# Patient Record
Sex: Male | Born: 1940 | Race: White | Hispanic: No | Marital: Married | Smoking: Former smoker
Health system: Southern US, Community
[De-identification: ages and names within clinical notes are randomized; demographics above are authoritative.]

## PROBLEM LIST (undated history)

## (undated) DIAGNOSIS — I739 Peripheral vascular disease, unspecified: Secondary | ICD-10-CM

## (undated) DIAGNOSIS — I10 Essential (primary) hypertension: Secondary | ICD-10-CM

## (undated) DIAGNOSIS — C801 Malignant (primary) neoplasm, unspecified: Secondary | ICD-10-CM

## (undated) DIAGNOSIS — E119 Type 2 diabetes mellitus without complications: Secondary | ICD-10-CM

## (undated) HISTORY — PX: COLONOSCOPY: SHX174

## (undated) HISTORY — PX: PROSTATECTOMY: SHX69

---

## 2018-02-01 ENCOUNTER — Emergency Department (HOSPITAL_COMMUNITY): Payer: Medicare Other

## 2018-02-01 ENCOUNTER — Emergency Department (HOSPITAL_COMMUNITY)
Admission: EM | Admit: 2018-02-01 | Discharge: 2018-02-01 | Disposition: A | Discharge status: Home / Self Care | Payer: Medicare Other | Attending: Emergency Medicine | Admitting: Emergency Medicine

## 2018-02-01 ENCOUNTER — Encounter (HOSPITAL_COMMUNITY): Payer: Self-pay

## 2018-02-01 DIAGNOSIS — S2232XA Fracture of one rib, left side, initial encounter for closed fracture: Secondary | ICD-10-CM | POA: Diagnosis not present

## 2018-02-01 DIAGNOSIS — S42302A Unspecified fracture of shaft of humerus, left arm, initial encounter for closed fracture: Secondary | ICD-10-CM | POA: Insufficient documentation

## 2018-02-01 DIAGNOSIS — Y939 Activity, unspecified: Secondary | ICD-10-CM | POA: Diagnosis not present

## 2018-02-01 DIAGNOSIS — Z23 Encounter for immunization: Secondary | ICD-10-CM | POA: Insufficient documentation

## 2018-02-01 DIAGNOSIS — Y999 Unspecified external cause status: Secondary | ICD-10-CM | POA: Insufficient documentation

## 2018-02-01 DIAGNOSIS — G40909 Epilepsy, unspecified, not intractable, without status epilepticus: Secondary | ICD-10-CM | POA: Diagnosis not present

## 2018-02-01 DIAGNOSIS — R109 Unspecified abdominal pain: Secondary | ICD-10-CM | POA: Diagnosis present

## 2018-02-01 DIAGNOSIS — I10 Essential (primary) hypertension: Secondary | ICD-10-CM | POA: Insufficient documentation

## 2018-02-01 DIAGNOSIS — Y929 Unspecified place or not applicable: Secondary | ICD-10-CM | POA: Insufficient documentation

## 2018-02-01 DIAGNOSIS — Z87891 Personal history of nicotine dependence: Secondary | ICD-10-CM | POA: Insufficient documentation

## 2018-02-01 DIAGNOSIS — W228XXA Striking against or struck by other objects, initial encounter: Secondary | ICD-10-CM | POA: Insufficient documentation

## 2018-02-01 DIAGNOSIS — Z79899 Other long term (current) drug therapy: Secondary | ICD-10-CM | POA: Diagnosis not present

## 2018-02-01 DIAGNOSIS — Z7984 Long term (current) use of oral hypoglycemic drugs: Secondary | ICD-10-CM | POA: Diagnosis not present

## 2018-02-01 DIAGNOSIS — E119 Type 2 diabetes mellitus without complications: Secondary | ICD-10-CM | POA: Insufficient documentation

## 2018-02-01 DIAGNOSIS — S42392A Other fracture of shaft of left humerus, initial encounter for closed fracture: Secondary | ICD-10-CM

## 2018-02-01 HISTORY — DX: Malignant (primary) neoplasm, unspecified: C80.1

## 2018-02-01 HISTORY — DX: Essential (primary) hypertension: I10

## 2018-02-01 HISTORY — DX: Type 2 diabetes mellitus without complications: E11.9

## 2018-02-01 LAB — CBC WITH DIFFERENTIAL/PLATELET
ABS IMMATURE GRANULOCYTES: 0.08 10*3/uL — AB (ref 0.00–0.07)
Basophils Absolute: 0.1 10*3/uL (ref 0.0–0.1)
Basophils Relative: 1 %
EOS PCT: 6 %
Eosinophils Absolute: 0.5 10*3/uL (ref 0.0–0.5)
HCT: 39.6 % (ref 39.0–52.0)
HEMOGLOBIN: 13 g/dL (ref 13.0–17.0)
Immature Granulocytes: 1 %
LYMPHS PCT: 21 %
Lymphs Abs: 1.7 10*3/uL (ref 0.7–4.0)
MCH: 29.2 pg (ref 26.0–34.0)
MCHC: 32.8 g/dL (ref 30.0–36.0)
MCV: 89 fL (ref 80.0–100.0)
MONO ABS: 0.5 10*3/uL (ref 0.1–1.0)
Monocytes Relative: 6 %
NEUTROS ABS: 5.4 10*3/uL (ref 1.7–7.7)
Neutrophils Relative %: 65 %
Platelets: 193 10*3/uL (ref 150–400)
RBC: 4.45 MIL/uL (ref 4.22–5.81)
RDW: 13.5 % (ref 11.5–15.5)
WBC: 8.2 10*3/uL (ref 4.0–10.5)
nRBC: 0 % (ref 0.0–0.2)

## 2018-02-01 LAB — BASIC METABOLIC PANEL
Anion gap: 12 (ref 5–15)
BUN: 8 mg/dL (ref 8–23)
CHLORIDE: 102 mmol/L (ref 98–111)
CO2: 23 mmol/L (ref 22–32)
Calcium: 8.8 mg/dL — ABNORMAL LOW (ref 8.9–10.3)
Creatinine, Ser: 1.36 mg/dL — ABNORMAL HIGH (ref 0.61–1.24)
GFR calc Af Amer: 57 mL/min — ABNORMAL LOW (ref >60)
GFR calc non Af Amer: 49 mL/min — ABNORMAL LOW (ref >60)
GLUCOSE: 154 mg/dL — AB (ref 70–99)
POTASSIUM: 3.2 mmol/L — AB (ref 3.5–5.1)
Sodium: 137 mmol/L (ref 135–145)

## 2018-02-01 MED ORDER — IOHEXOL 300 MG/ML  SOLN
100.0000 mL | Freq: Once | INTRAMUSCULAR | Status: AC | PRN
  Administered 2018-02-01: 100 mL via INTRAVENOUS

## 2018-02-01 MED ORDER — HYDROMORPHONE HCL 1 MG/ML IJ SOLN
0.5000 mg | Freq: Once | INTRAMUSCULAR | Status: AC
  Administered 2018-02-01: 0.5 mg via INTRAVENOUS
  Filled 2018-02-01: qty 1

## 2018-02-01 MED ORDER — OXYCODONE-ACETAMINOPHEN 5-325 MG PO TABS: 1.0000 | ORAL_TABLET | ORAL | 0 refills | Status: DC | PRN

## 2018-02-01 MED ORDER — TETANUS-DIPHTH-ACELL PERTUSSIS 5-2.5-18.5 LF-MCG/0.5 IM SUSP
0.5000 mL | Freq: Once | INTRAMUSCULAR | Status: AC
  Administered 2018-02-01: 0.5 mL via INTRAMUSCULAR
  Filled 2018-02-01: qty 0.5

## 2018-02-01 NOTE — Discharge Instructions (Addendum)
Please call the orthopedic surgeon for follow-up

## 2018-02-01 NOTE — ED Notes (Signed)
The pt had a jerking episode that lasted approx 45 secs with loc.  Post ictal immdediately following with low sats  Nasal 02 at 4 liters

## 2018-02-01 NOTE — ED Notes (Signed)
Pt responding now  His pain is better but he feels terrible

## 2018-02-01 NOTE — ED Notes (Signed)
Pt looking for a ride home

## 2018-02-01 NOTE — Progress Notes (Signed)
Orthopedic Tech Progress Note Patient Details:  Jose Alvarado November 30, 1940 [id number]  Ortho Devices Type of Ortho Device: Arm sling, Coapt Ortho Device/Splint Location: lue Ortho Device/Splint Interventions: Ordered, Application, Adjustment   Post Interventions Patient Tolerated: Well Instructions Provided: Care of device, Adjustment of device   Jose Alvarado 02/01/2018, 6:38 PM

## 2018-02-01 NOTE — ED Notes (Addendum)
Dr Venora Maples attempting to place temporary splint to pt's left arm. Pt began having seizure like activity, shaking, unresponsive x 45 seconds. Pt then became responsive and began talking again. Pt was asked at this time if he had hx of seizures and said no.

## 2018-02-01 NOTE — ED Notes (Addendum)
Ortho paged to come to room per Dr Venora Maples request. Dr Venora Maples gave order for 0.5mg  dilaudid

## 2018-02-01 NOTE — ED Provider Notes (Signed)
[facility] EMERGENCY DEPARTMENT Provider Note   CSN: [id number] Arrival date & time: 02/01/18  1517     History   Chief Complaint Chief Complaint  Patient presents with  . Arm Injury    HPI Jose Alvarado is a 77 y.o. male.  HPI 77 year old male presents the emergency department after a limb was being cut down off a tree and swung and struck him in the left side.  He presents with obvious deformity to his left midshaft humerus.  He reports severe pain in his left arm.  Denies closed head injury.  He was not wearing a hard hat.  Denies significant neck pain.  Reports left lateral chest and abdominal pain.  Symptoms are moderate to severe in severity in his left humerus.  Pain in his chest and abdomen are mild in severity.  Moves all 4 extremities.  Normal grip strength left hand.   Past Medical History:  Diagnosis Date  . Cancer [facility])    prostate  . Diabetes mellitus without complication ([place])   . Hypertension     There are no active problems to display for this patient.   Past Surgical History:  Procedure Laterality Date  . PROSTATECTOMY          Home Medications    Prior to Admission medications   Medication Sig Start Date End Date Taking? Authorizing Provider  carbamazepine (TEGRETOL) 200 MG tablet Take 200 mg by mouth 4 (four) times daily.   Yes [provider]  metFORMIN (GLUCOPHAGE) 500 MG tablet Take 500 mg by mouth 2 (two) times a week. 12/09/17  Yes [provider]  pantoprazole (PROTONIX) 20 MG tablet Take 20 mg by mouth daily. 01/19/18  Yes [provider]  pioglitazone (ACTOS) 15 MG tablet Take 15 mg by mouth every morning. 07/26/17  Yes [provider]    Family History History reviewed. No pertinent family history.  Social History Social History   Tobacco Use  . Smoking status: Former Smoker    Last attempt to quit: 02/01/1998    Years since quitting: 20.0  Substance Use Topics  . Alcohol  use: Never    Frequency: Never  . Drug use: Never     Allergies   Patient has no known allergies.   Review of Systems Review of Systems  All other systems reviewed and are negative.    Physical Exam Updated Vital Signs BP 137/72   Pulse 72   Temp 98.1 F (36.7 C) (Oral)   Resp (!) 22   SpO2 93%   Physical Exam  Constitutional: He is oriented to person, place, and time. He appears well-developed and well-nourished.  HENT:  Head: Normocephalic and atraumatic.  Eyes: EOM are normal.  Neck: Normal range of motion.  Cardiovascular: Normal rate, regular rhythm and normal heart sounds.  Pulmonary/Chest: Effort normal and breath sounds normal. No respiratory distress.  Tenderness left lateral chest wall without crepitus  Abdominal: Soft. He exhibits no distension. There is no tenderness.  Musculoskeletal: Normal range of motion.  Obvious midshaft humerus fracture on the left with instability and angulation.  Normal left radial pulse.  Normal grip strength left hand.  Full range of motion bilateral hips, knees, ankles.  Full range of motion of bilateral shoulders, wrists, right elbow.  Limited range of motion of the left elbow secondary to pain  Neurological: He is alert and oriented to person, place, and time.  Skin: Skin is warm and dry.  Psychiatric: He has a  normal mood and affect. Judgment normal.  Nursing note and vitals reviewed.    ED Treatments / Results  Labs (all labs ordered are listed, but only abnormal results are displayed) Labs Reviewed  CBC WITH DIFFERENTIAL/PLATELET - Abnormal; Notable for the following components:      Result Value   Abs Immature Granulocytes 0.08 (*)    All other components within normal limits  BASIC METABOLIC PANEL - Abnormal; Notable for the following components:   Potassium 3.2 (*)    Glucose, Bld 154 (*)    Creatinine, Ser 1.36 (*)    Calcium 8.8 (*)    GFR calc non Af Amer 49 (*)    GFR calc Af Amer 57 (*)    All other  components within normal limits    EKG EKG Interpretation  Date/Time:  Wednesday February 01 2018 15:41:10 EST Ventricular Rate:  67 PR Interval:    QRS Duration: 101 QT Interval:  404 QTC Calculation: 427 R Axis:   27 Text Interpretation:  normal sinus rhythm Borderline repolarization abnormality Confirmed by Jola Schmidt [phone]) on 02/01/2018 4:28:26 PM   Radiology Ct Head Wo Contrast  Result Date: 02/01/2018 CLINICAL DATA:  A large branch fell on the patient's left side while cutting a tree today. Seizure. EXAM: CT HEAD WITHOUT CONTRAST CT CERVICAL SPINE WITHOUT CONTRAST TECHNIQUE: Multidetector CT imaging of the head and cervical spine was performed following the standard protocol without intravenous contrast. Multiplanar CT image reconstructions of the cervical spine were also generated. COMPARISON:  None. FINDINGS: CT HEAD FINDINGS Brain: The brainstem, cerebellum, cerebral peduncles, thalami, basal ganglia, basilar cisterns, and ventricular system appear within normal limits. No intracranial hemorrhage, mass lesion, or acute CVA. Vascular: Unremarkable Skull: Unremarkable Sinuses/Orbits: There is complete opacification of the left frontal sinus, most of the ethmoid air cells, the left sphenoid sinus, and the right maxillary sinus, with mixed density in the right maxillary sinus noted. There is also some mixed density in the nearly completely opacified right frontal sinus which also contains a small amount of gas, as well as the nearly completely opacified left maxillary and right sphenoid sinus. Other: No supplemental non-categorized findings. CT CERVICAL SPINE FINDINGS Alignment: No vertebral subluxation is observed. Skull base and vertebrae: There is calcification along the transverse ligament of C1 with spurring at the anterior C1-2 articulation. No appreciable cervical spine fracture. Soft tissues and spinal canal: Unremarkable Disc levels:  No significant bony impingement. Upper  chest: Faint interstitial accentuation. Please see dedicated CT of the chest report. Other: No supplemental non-categorized findings. IMPRESSION: 1. No acute intracranial findings or acute cervical spine findings. 2. Prominent paranasal sinusitis, with multiple sinuses completely opacified by mixed density contents. Underlying acute sinusitis is not excluded. Electronically Signed   By: Van Clines M.D.   On: 02/01/2018 18:02   Ct Cervical Spine Wo Contrast  Result Date: 02/01/2018 CLINICAL DATA:  A large branch fell on the patient's left side while cutting a tree today. Seizure. EXAM: CT HEAD WITHOUT CONTRAST CT CERVICAL SPINE WITHOUT CONTRAST TECHNIQUE: Multidetector CT imaging of the head and cervical spine was performed following the standard protocol without intravenous contrast. Multiplanar CT image reconstructions of the cervical spine were also generated. COMPARISON:  None. FINDINGS: CT HEAD FINDINGS Brain: The brainstem, cerebellum, cerebral peduncles, thalami, basal ganglia, basilar cisterns, and ventricular system appear within normal limits. No intracranial hemorrhage, mass lesion, or acute CVA. Vascular: Unremarkable Skull: Unremarkable Sinuses/Orbits: There is complete opacification of the left frontal sinus, most of the  ethmoid air cells, the left sphenoid sinus, and the right maxillary sinus, with mixed density in the right maxillary sinus noted. There is also some mixed density in the nearly completely opacified right frontal sinus which also contains a small amount of gas, as well as the nearly completely opacified left maxillary and right sphenoid sinus. Other: No supplemental non-categorized findings. CT CERVICAL SPINE FINDINGS Alignment: No vertebral subluxation is observed. Skull base and vertebrae: There is calcification along the transverse ligament of C1 with spurring at the anterior C1-2 articulation. No appreciable cervical spine fracture. Soft tissues and spinal canal:  Unremarkable Disc levels:  No significant bony impingement. Upper chest: Faint interstitial accentuation. Please see dedicated CT of the chest report. Other: No supplemental non-categorized findings. IMPRESSION: 1. No acute intracranial findings or acute cervical spine findings. 2. Prominent paranasal sinusitis, with multiple sinuses completely opacified by mixed density contents. Underlying acute sinusitis is not excluded. Electronically Signed   By: Van Clines M.D.   On: 02/01/2018 18:02   Dg Chest Portable 1 View  Result Date: 02/01/2018 CLINICAL DATA:  Hit by falling tree branch.  Left rib pain EXAM: PORTABLE CHEST 1 VIEW COMPARISON:  None. FINDINGS: There is a fracture through the left lateral 7th rib. No effusion or pneumothorax. Heart is normal size. No confluent airspace opacities. Old healed right rib fractures. IMPRESSION: Nondisplaced left lateral 7th rib fracture. No acute cardiopulmonary disease. Electronically Signed   By: Rolm Baptise M.D.   On: 02/01/2018 16:04   Dg Humerus Left  Result Date: 02/01/2018 CLINICAL DATA:  Acute LEFT arm pain following tree injury today. Initial encounter. EXAM: LEFT HUMERUS - 2+ VIEW COMPARISON:  None. FINDINGS: A transverse fracture of the mid-distal humeral diaphysis is noted with 1 cm anterior displacement and apex LATERAL angulation. No other acute bony or joint abnormalities noted. IMPRESSION: Displaced angulated mid-distal humeral fracture. Electronically Signed   By: Margarette Canada M.D.   On: 02/01/2018 16:06    Procedures .Splint Application Performed by: Jola Schmidt, MD Authorized by: Jola Schmidt, MD   .Ortho Injury Treatment Performed by: Jola Schmidt, MD Authorized by: Jola Schmidt, MD     [place] APPLICATION Authorized by: Jola Schmidt Consent: Verbal consent obtained. Risks and benefits: risks, benefits and alternatives were discussed Consent given by: patient Splint applied by: orthopedic technician Location details:  left upper extremity Splint type: Coaptation splint Supplies used: orthoglass Post-procedure: The splinted body part was neurovascularly unchanged following the procedure. Patient tolerance: Patient tolerated the procedure well with no immediate complications.   Definitive Fracture Care  Definitive fracture care was performed for the patient's left rib fracture. Treatment includes management of pain Fracture related discharge instructions were provided Symptomatic control measures provided to the patient     Medications Ordered in ED Medications  HYDROmorphone (DILAUDID) injection 0.5 mg (0.5 mg Intravenous Given 02/01/18 1538)  Tdap (BOOSTRIX) injection 0.5 mL (0.5 mLs Intramuscular Given 02/01/18 1839)  iohexol (OMNIPAQUE) 300 MG/ML solution 100 mL (100 mLs Intravenous Contrast Given 02/01/18 1725)     Initial Impression / Assessment and Plan / ED Course  I have reviewed the triage vital signs and the nursing notes.  Pertinent labs & imaging results that were available during my care of the patient were reviewed by me and considered in my medical decision making (see chart for details).     Pain controlled in the emergency department.  Ambulatory in the ER.  Home with pain medication.  Standard rib fracture precautions given.  Outpatient orthopedic  follow-up for his midshaft left humerus fracture.  Normal left radial pulse.  Coaptation splint placed for comfort and stability.  Patient understands return to the emergency department for new or worsening symptoms.  Final Clinical Impressions(s) / ED Diagnoses   Final diagnoses:  Other fracture of shaft of left humerus, initial encounter for closed fracture  Closed fracture of one rib of left side, initial encounter    ED Discharge Orders    None       Jola Schmidt, MD 02/01/18 [phone]

## 2018-02-01 NOTE — ED Notes (Signed)
Money in wallet 1359.86 placed in security  Paper with pt  Pt notified thaT his ring 2 wallets and kknife  Were placed in the safe

## 2018-02-01 NOTE — ED Notes (Signed)
The pt has talked to his wife on the phone  She does not drive shes blind

## 2018-02-01 NOTE — ED Triage Notes (Signed)
Pt was cutting a tree and a large brand fell and landed on the pt's left arm. Pt has obvious left humurus deformity. Strong radial pulse in affected extremity. Pt was drowsy upon ems arrival, given 161mcg of fentanyl. VSS. Axox4.

## 2018-02-01 NOTE — ED Notes (Signed)
The pt returned from c-t alert pain in arm with movwemwent

## 2018-02-01 NOTE — ED Notes (Signed)
This RN in CT with pt. Pt tolerating CT well.

## 2018-02-02 NOTE — ED Notes (Signed)
Money and personal belongings returned to the pt before he left

## 2018-02-06 ENCOUNTER — Encounter (INDEPENDENT_AMBULATORY_CARE_PROVIDER_SITE_OTHER): Payer: Self-pay | Admitting: Orthopedic Surgery

## 2018-02-06 ENCOUNTER — Encounter (HOSPITAL_COMMUNITY): Payer: Self-pay | Admitting: *Deleted

## 2018-02-06 ENCOUNTER — Other Ambulatory Visit (INDEPENDENT_AMBULATORY_CARE_PROVIDER_SITE_OTHER): Payer: Self-pay | Admitting: Orthopedic Surgery

## 2018-02-06 ENCOUNTER — Ambulatory Visit (INDEPENDENT_AMBULATORY_CARE_PROVIDER_SITE_OTHER): Payer: Medicare Other | Admitting: Orthopedic Surgery

## 2018-02-06 ENCOUNTER — Other Ambulatory Visit: Payer: Self-pay

## 2018-02-06 DIAGNOSIS — S42322A Displaced transverse fracture of shaft of humerus, left arm, initial encounter for closed fracture: Secondary | ICD-10-CM | POA: Diagnosis not present

## 2018-02-06 NOTE — Progress Notes (Signed)
Office Visit Note   Patient: Jose Alvarado           Date of Birth: 1940/07/20           MRN: [id number] Visit Date: 02/06/2018 Requested by: No referring provider defined for this encounter. PCP: Patient, No Pcp Per  Subjective: Chief Complaint  Patient presents with  . Left Upper Arm - Fracture, Pain    HPI: Patient presents with left arm pain.  Injured his left arm 02/01/2018 when a branch hit him.  He is right-hand dominant.  He still does carpentry work.  He does take medication for diabetes.  He lives with his wife who cannot really be particularly helpful during a recovery.Marland Kitchen  He denies any numbness and tingling in the hand but does report swelling.  Denies any other orthopedic complaints              ROS: All systems reviewed are negative as they relate to the chief complaint within the history of present illness.  Patient denies  fevers or chills.   Assessment & Plan: Visit Diagnoses:  1. Displaced transverse fracture of shaft of humerus, left arm, initial encounter for closed fracture     Plan: Impression is displaced fracture of the left humerus.  Plan is lengthy discussion about operative and nonoperative treatment options.  Patient is in a significant amount of pain with this left arm.  I think his best option would be open reduction internal fixation of the fracture.  Risk and benefits are discussed including but not limited to infection nerve vessel damage potential need for more surgery.  He will need to have a period of diminished activity after the surgery.  Patient understands risk benefits and wishes to proceed.  All questions answered  Follow-Up Instructions: Return in about 1 week (around 02/13/2018).   Orders:  No orders of the defined types were placed in this encounter.  No orders of the defined types were placed in this encounter.     Procedures: No procedures performed   Clinical Data: No additional findings.  Objective: Vital Signs: There were  no vitals taken for this visit.  Physical Exam:   Constitutional: Patient appears well-developed HEENT:  Head: Normocephalic Eyes:EOM are normal Neck: Normal range of motion Cardiovascular: Normal rate Pulmonary/chest: Effort normal Neurologic: Patient is alert Skin: Skin is warm Psychiatric: Patient has normal mood and affect    Ortho Exam: Ortho exam demonstrates unstable fracture of the humeral shaft.  Patient does has impact and intact EPL FPL interosseous function with palpable radial pulse on the left-hand side.  Does have some bruising ecchymosis in the skin area but the skin is intact down to the flexion crease which has a slight amount of maceration or dampness from his flexed position.  Specialty Comments:  No specialty comments available.  Imaging: No results found.   PMFS History: There are no active problems to display for this patient.  Past Medical History:  Diagnosis Date  . Cancer [facility])    prostate  . Diabetes mellitus without complication ([place])   . Hypertension   . Peripheral vascular disease ([place])    PE 35 years after a broken leg    History reviewed. No pertinent family history.  Past Surgical History:  Procedure Laterality Date  . COLONOSCOPY    . PROSTATECTOMY     Social History   Occupational History  . Not on file  Tobacco Use  . Smoking status: Former Smoker    Last attempt  to quit: 02/01/1998    Years since quitting: 20.0  . Smokeless tobacco: Never Used  Substance and Sexual Activity  . Alcohol use: Never    Frequency: Never  . Drug use: Never  . Sexual activity: Not on file

## 2018-02-06 NOTE — Progress Notes (Signed)
Spoke with pt for pre-op for pre-op call. Pt denies cardiac history and HTN. States he's type 2 diabetic. Dones not know what his last A1C was. States he checks his blood sugar at night. Instructed pt not to take his Actos or Metformin in the AM. Instructed him to check his blood sugar in the AM when he gets up and every 2 hours until he leaves for the hospital. If blood sugar is 70 or below, treat with 1/2 cup of clear juice (apple or cranberry) and recheck blood sugar 15 minutes after drinking juice. If blood sugar continues to be 70 or below, call the Short Stay department and ask to speak to a nurse. Pt voiced understanding  Pt is to call the OR desk at 8 AM to find out time of surgery. Instructed pt to arrive 2 1/2 hours prior to that time. In Dr. Randel Pigg note it looks like surgery will be 6 PM or later. I spoke with Dr. Therisa Doyne prior to calling pt and he stated that pt could eat a regular breakfast by 9:00 AM, then clear liquids until 1:30 PM and then NPO. I gave these instructions to the patient and he voiced understanding.

## 2018-02-07 ENCOUNTER — Ambulatory Visit (HOSPITAL_COMMUNITY): Payer: Medicare Other | Admitting: Certified Registered Nurse Anesthetist

## 2018-02-07 ENCOUNTER — Ambulatory Visit (HOSPITAL_COMMUNITY)
Admission: RE | Admit: 2018-02-07 | Discharge: 2018-02-07 | Disposition: A | Discharge status: Home / Self Care | Payer: Medicare Other | Source: Ambulatory Visit | Attending: Orthopedic Surgery | Admitting: Orthopedic Surgery

## 2018-02-07 ENCOUNTER — Encounter (HOSPITAL_COMMUNITY)
Admission: RE | Disposition: A | Discharge status: Home / Self Care | Payer: Self-pay | Source: Ambulatory Visit | Attending: Orthopedic Surgery

## 2018-02-07 ENCOUNTER — Ambulatory Visit (HOSPITAL_COMMUNITY): Payer: Medicare Other

## 2018-02-07 ENCOUNTER — Encounter (HOSPITAL_COMMUNITY): Payer: Self-pay

## 2018-02-07 DIAGNOSIS — Y9289 Other specified places as the place of occurrence of the external cause: Secondary | ICD-10-CM | POA: Insufficient documentation

## 2018-02-07 DIAGNOSIS — S42322A Displaced transverse fracture of shaft of humerus, left arm, initial encounter for closed fracture: Secondary | ICD-10-CM | POA: Diagnosis present

## 2018-02-07 DIAGNOSIS — E1151 Type 2 diabetes mellitus with diabetic peripheral angiopathy without gangrene: Secondary | ICD-10-CM | POA: Diagnosis not present

## 2018-02-07 DIAGNOSIS — Z419 Encounter for procedure for purposes other than remedying health state, unspecified: Secondary | ICD-10-CM

## 2018-02-07 DIAGNOSIS — Z79899 Other long term (current) drug therapy: Secondary | ICD-10-CM | POA: Diagnosis not present

## 2018-02-07 DIAGNOSIS — W208XXA Other cause of strike by thrown, projected or falling object, initial encounter: Secondary | ICD-10-CM | POA: Insufficient documentation

## 2018-02-07 DIAGNOSIS — Z87891 Personal history of nicotine dependence: Secondary | ICD-10-CM | POA: Diagnosis not present

## 2018-02-07 DIAGNOSIS — Z7984 Long term (current) use of oral hypoglycemic drugs: Secondary | ICD-10-CM | POA: Diagnosis not present

## 2018-02-07 DIAGNOSIS — I10 Essential (primary) hypertension: Secondary | ICD-10-CM | POA: Insufficient documentation

## 2018-02-07 DIAGNOSIS — K219 Gastro-esophageal reflux disease without esophagitis: Secondary | ICD-10-CM | POA: Insufficient documentation

## 2018-02-07 HISTORY — PX: ORIF HUMERUS FRACTURE: SHX2126

## 2018-02-07 HISTORY — DX: Peripheral vascular disease, unspecified: I73.9

## 2018-02-07 LAB — GLUCOSE, CAPILLARY
GLUCOSE-CAPILLARY: 132 mg/dL — AB (ref 70–99)
Glucose-Capillary: 113 mg/dL — ABNORMAL HIGH (ref 70–99)

## 2018-02-07 SURGERY — OPEN REDUCTION INTERNAL FIXATION (ORIF) PROXIMAL HUMERUS FRACTURE
Anesthesia: Regional | Site: Shoulder | Laterality: Left

## 2018-02-07 MED ORDER — ROCURONIUM BROMIDE 10 MG/ML (PF) SYRINGE
PREFILLED_SYRINGE | INTRAVENOUS | Status: DC | PRN
  Administered 2018-02-07 (×2): 50 mg via INTRAVENOUS

## 2018-02-07 MED ORDER — ROCURONIUM BROMIDE 10 MG/ML (PF) SYRINGE: PREFILLED_SYRINGE | INTRAVENOUS | Status: DC | PRN

## 2018-02-07 MED ORDER — MIDAZOLAM HCL 2 MG/2ML IJ SOLN
INTRAMUSCULAR | Status: AC
  Filled 2018-02-07: qty 2

## 2018-02-07 MED ORDER — FENTANYL CITRATE (PF) 100 MCG/2ML IJ SOLN
25.0000 ug | INTRAMUSCULAR | Status: DC | PRN
  Administered 2018-02-07 (×2): 50 ug via INTRAVENOUS

## 2018-02-07 MED ORDER — LACTATED RINGERS IV SOLN
INTRAVENOUS | Status: DC
  Administered 2018-02-07 (×2): via INTRAVENOUS

## 2018-02-07 MED ORDER — FENTANYL CITRATE (PF) 250 MCG/5ML IJ SOLN
INTRAMUSCULAR | Status: AC
  Filled 2018-02-07: qty 5

## 2018-02-07 MED ORDER — FENTANYL CITRATE (PF) 250 MCG/5ML IJ SOLN
INTRAMUSCULAR | Status: DC | PRN
  Administered 2018-02-07 (×2): 50 ug via INTRAVENOUS

## 2018-02-07 MED ORDER — DEXAMETHASONE SODIUM PHOSPHATE 10 MG/ML IJ SOLN
INTRAMUSCULAR | Status: DC | PRN
  Administered 2018-02-07 (×2): 5 mg via INTRAVENOUS

## 2018-02-07 MED ORDER — PROPOFOL 10 MG/ML IV BOLUS
INTRAVENOUS | Status: DC | PRN
  Administered 2018-02-07: 160 mg via INTRAVENOUS

## 2018-02-07 MED ORDER — LIDOCAINE 2% (20 MG/ML) 5 ML SYRINGE
INTRAMUSCULAR | Status: DC | PRN
  Administered 2018-02-07: 40 mg via INTRAVENOUS

## 2018-02-07 MED ORDER — CEFAZOLIN SODIUM-DEXTROSE 2-4 GM/100ML-% IV SOLN
2.0000 g | INTRAVENOUS | Status: AC
  Administered 2018-02-07: 2 g via INTRAVENOUS

## 2018-02-07 MED ORDER — ROCURONIUM BROMIDE 50 MG/5ML IV SOSY
PREFILLED_SYRINGE | INTRAVENOUS | Status: AC
  Filled 2018-02-07: qty 5

## 2018-02-07 MED ORDER — BUPIVACAINE HCL (PF) 0.25 % IJ SOLN
INTRAMUSCULAR | Status: AC
  Filled 2018-02-07: qty 30

## 2018-02-07 MED ORDER — FENTANYL CITRATE (PF) 100 MCG/2ML IJ SOLN
INTRAMUSCULAR | Status: AC
  Filled 2018-02-07: qty 2

## 2018-02-07 MED ORDER — ROPIVACAINE HCL 5 MG/ML IJ SOLN
INTRAMUSCULAR | Status: DC | PRN
  Administered 2018-02-07: 30 mL via PERINEURAL

## 2018-02-07 MED ORDER — EPHEDRINE SULFATE 50 MG/ML IJ SOLN
INTRAMUSCULAR | Status: DC | PRN
  Administered 2018-02-07 (×2): 10 mg via INTRAVENOUS
  Administered 2018-02-07 (×2): 15 mg via INTRAVENOUS

## 2018-02-07 MED ORDER — SUCCINYLCHOLINE CHLORIDE 200 MG/10ML IV SOSY
PREFILLED_SYRINGE | INTRAVENOUS | Status: AC
  Filled 2018-02-07: qty 10

## 2018-02-07 MED ORDER — CHLORHEXIDINE GLUCONATE 4 % EX LIQD: 60.0000 mL | Freq: Once | CUTANEOUS | Status: DC

## 2018-02-07 MED ORDER — EPHEDRINE 5 MG/ML INJ
INTRAVENOUS | Status: AC
  Filled 2018-02-07: qty 20

## 2018-02-07 MED ORDER — ONDANSETRON HCL 4 MG/2ML IJ SOLN
INTRAMUSCULAR | Status: DC | PRN
  Administered 2018-02-07: 4 mg via INTRAVENOUS

## 2018-02-07 MED ORDER — SUGAMMADEX SODIUM 200 MG/2ML IV SOLN
INTRAVENOUS | Status: DC | PRN
  Administered 2018-02-07: 300 mg via INTRAVENOUS

## 2018-02-07 MED ORDER — FENTANYL CITRATE (PF) 100 MCG/2ML IJ SOLN
INTRAMUSCULAR | Status: AC
  Administered 2018-02-07: 50 ug via INTRAVENOUS
  Filled 2018-02-07: qty 2

## 2018-02-07 MED ORDER — CEFAZOLIN SODIUM-DEXTROSE 2-4 GM/100ML-% IV SOLN
INTRAVENOUS | Status: AC
  Filled 2018-02-07: qty 100

## 2018-02-07 MED ORDER — ONDANSETRON HCL 4 MG/2ML IJ SOLN: 4.0000 mg | Freq: Once | INTRAMUSCULAR | Status: DC | PRN

## 2018-02-07 MED ORDER — FENTANYL CITRATE (PF) 100 MCG/2ML IJ SOLN
50.0000 ug | Freq: Once | INTRAMUSCULAR | Status: AC
  Administered 2018-02-07: 50 ug via INTRAVENOUS

## 2018-02-07 SURGICAL SUPPLY — 54 items
ALCOHOL 70% 16 OZ (MISCELLANEOUS) ×3 IMPLANT
BANDAGE ACE 4X5 VEL STRL LF (GAUZE/BANDAGES/DRESSINGS) IMPLANT
BANDAGE ACE 6X5 VEL STRL LF (GAUZE/BANDAGES/DRESSINGS) IMPLANT
BENZOIN TINCTURE PRP APPL 2/3 (GAUZE/BANDAGES/DRESSINGS) IMPLANT
BIT DRILL NCB 4 L24 (BIT) ×3 IMPLANT
BIT DRILL QC 3.3X195 (BIT) ×3 IMPLANT
BNDG COHESIVE 4X5 TAN STRL (GAUZE/BANDAGES/DRESSINGS) ×3 IMPLANT
CAP LOCK NCB (Cap) ×12 IMPLANT
CHLORAPREP W/TINT 26ML (MISCELLANEOUS) ×6 IMPLANT
COVER SURGICAL LIGHT HANDLE (MISCELLANEOUS) ×3 IMPLANT
COVER WAND RF STERILE (DRAPES) ×3 IMPLANT
DRAIN PENROSE 1/2X12 LTX STRL (WOUND CARE) IMPLANT
DRAPE C-ARM 42X72 X-RAY (DRAPES) ×3 IMPLANT
DRAPE IMP U-DRAPE 54X76 (DRAPES) ×3 IMPLANT
DRAPE U-SHAPE 47X51 STRL (DRAPES) ×6 IMPLANT
DRSG PAD ABDOMINAL 8X10 ST (GAUZE/BANDAGES/DRESSINGS) IMPLANT
ELECT REM PT RETURN 9FT ADLT (ELECTROSURGICAL) ×3
ELECTRODE REM PT RTRN 9FT ADLT (ELECTROSURGICAL) ×1 IMPLANT
FACESHIELD WRAPAROUND (MASK) IMPLANT
GAUZE SPONGE 4X4 12PLY STRL (GAUZE/BANDAGES/DRESSINGS) ×6 IMPLANT
GAUZE XEROFORM 5X9 LF (GAUZE/BANDAGES/DRESSINGS) IMPLANT
GLOVE BIOGEL PI IND STRL 8 (GLOVE) ×1 IMPLANT
GLOVE BIOGEL PI INDICATOR 8 (GLOVE) ×2
GLOVE SURG ORTHO 8.0 STRL STRW (GLOVE) ×3 IMPLANT
GOWN STRL REUS W/ TWL LRG LVL3 (GOWN DISPOSABLE) ×2 IMPLANT
GOWN STRL REUS W/ TWL XL LVL3 (GOWN DISPOSABLE) ×1 IMPLANT
GOWN STRL REUS W/TWL LRG LVL3 (GOWN DISPOSABLE) ×4
GOWN STRL REUS W/TWL XL LVL3 (GOWN DISPOSABLE) ×2
HYDROGEN PEROXIDE 16OZ (MISCELLANEOUS) ×3 IMPLANT
KIT BASIN OR (CUSTOM PROCEDURE TRAY) ×3 IMPLANT
KIT TURNOVER KIT B (KITS) ×3 IMPLANT
MANIFOLD NEPTUNE II (INSTRUMENTS) ×3 IMPLANT
NEEDLE 21X1 OR PACK (NEEDLE) IMPLANT
NS IRRIG 1000ML POUR BTL (IV SOLUTION) ×3 IMPLANT
PACK SHOULDER (CUSTOM PROCEDURE TRAY) ×3 IMPLANT
PAD ARMBOARD 7.5X6 YLW CONV (MISCELLANEOUS) ×6 IMPLANT
PAD CAST 4YDX4 CTTN HI CHSV (CAST SUPPLIES) IMPLANT
PADDING CAST COTTON 4X4 STRL (CAST SUPPLIES)
PLATE NCB STRT PA 146 10H (Plate) ×3 IMPLANT
SCREW NCB 4.0 28MM (Screw) ×12 IMPLANT
SCREW NCB 4.0 32MM (Screw) ×6 IMPLANT
SCREW NCB 4.0MX30M (Screw) ×6 IMPLANT
SCREW NCB 4.0X26MM (Screw) ×9 IMPLANT
SPONGE LAP 18X18 X RAY DECT (DISPOSABLE) ×3 IMPLANT
SPONGE LAP 4X18 RFD (DISPOSABLE) IMPLANT
STAPLER VISISTAT 35W (STAPLE) IMPLANT
STOCKINETTE IMPERVIOUS 9X36 MD (GAUZE/BANDAGES/DRESSINGS) IMPLANT
SUCTION FRAZIER HANDLE 10FR (MISCELLANEOUS)
SUCTION TUBE FRAZIER 10FR DISP (MISCELLANEOUS) IMPLANT
SUT VIC AB 2-0 CTB1 (SUTURE) ×9 IMPLANT
TOWEL OR 17X24 6PK STRL BLUE (TOWEL DISPOSABLE) ×3 IMPLANT
TOWEL OR 17X26 10 PK STRL BLUE (TOWEL DISPOSABLE) ×3 IMPLANT
WATER STERILE IRR 1000ML POUR (IV SOLUTION) ×3 IMPLANT
YANKAUER SUCT BULB TIP NO VENT (SUCTIONS) IMPLANT

## 2018-02-07 NOTE — Anesthesia Preprocedure Evaluation (Addendum)
Anesthesia Evaluation  Patient identified by MRN, date of birth, ID band Patient awake    Reviewed: Allergy & Precautions, NPO status , Patient's Chart, lab work & pertinent test results  Airway Mallampati: III  TM Distance: >3 FB Neck ROM: Full    Dental  (+) Edentulous Upper, Edentulous Lower   Pulmonary former smoker, PE   Pulmonary exam normal breath sounds clear to auscultation       Cardiovascular hypertension, + Peripheral Vascular Disease  Normal cardiovascular exam Rhythm:Regular Rate:Normal  ECG: NSR, rate 67   Neuro/Psych negative neurological ROS  negative psych ROS   GI/Hepatic Neg liver ROS, GERD  Medicated and Controlled,  Endo/Other  diabetes, Oral Hypoglycemic Agents  Renal/GU negative Renal ROS     Musculoskeletal Motor and sensation intact of left hand   Abdominal (+) + obese,   Peds  Hematology negative hematology ROS (+)   Anesthesia Other Findings Displaced angulated mid-distal humeral fracture on the left  Reproductive/Obstetrics                            Anesthesia Physical Anesthesia Plan  ASA: III  Anesthesia Plan: General and Regional   Post-op Pain Management: GA combined w/ Regional for post-op pain   Induction: Intravenous  PONV Risk Score and Plan: 2 and Ondansetron, Dexamethasone and Treatment may vary due to age or medical condition  Airway Management Planned: Oral ETT  Additional Equipment:   Intra-op Plan:   Post-operative Plan: Extubation in OR  Informed Consent: I have reviewed the patients History and Physical, chart, labs and discussed the procedure including the risks, benefits and alternatives for the proposed anesthesia with the patient or authorized representative who has indicated his/her understanding and acceptance.   Dental advisory given  Plan Discussed with: CRNA  Anesthesia Plan Comments:        Anesthesia Quick  Evaluation

## 2018-02-07 NOTE — Anesthesia Procedure Notes (Signed)
Anesthesia Regional Block: Supraclavicular block   Pre-Anesthetic Checklist: ,, timeout performed, Correct Patient, Correct Site, Correct Laterality, Correct Procedure, Correct Position, site marked, Risks and benefits discussed,  Surgical consent,  Pre-op evaluation,  At surgeon's request and post-op pain management  Laterality: Left  Prep: chloraprep       Needles:  Injection technique: Single-shot  Needle Type: Echogenic Stimulator Needle     Needle Length: 9cm  Needle Gauge: 21     Additional Needles:   Procedures:,,,, ultrasound used (permanent image in chart),,,,  Narrative:  Start time: 02/07/2018 4:20 PM End time: 02/07/2018 4:30 PM Injection made incrementally with aspirations every 5 mL.  Performed by: Personally  Anesthesiologist: Murvin Natal, MD  Additional Notes: Functioning IV was confirmed and monitors were applied.  A timeout was performed. Sterile prep, hand hygiene and sterile gloves were used. A 77mm 21ga Arrow echogenic stimulator needle was used. Negative aspiration and negative test dose prior to incremental administration of local anesthetic. The patient tolerated the procedure well.  Ultrasound guidance: relevent anatomy identified, needle position confirmed, local anesthetic spread visualized around nerve(s), vascular puncture avoided.  Image printed for medical record.

## 2018-02-07 NOTE — H&P (Signed)
Jose Alvarado is an 77 y.o. male.   Chief Complaint: Left arm pain HPI: Jose Alvarado is a patient who was clearing out his land approximately a week ago when a large branch fell and injured his left arm.  Patient sustained mid to distal transverse humeral shaft fracture.  He has been in a shoulder immobilizer and shoulder spica which is been uncomfortable for him.  He denies any other orthopedic injuries.  Denies any numbness and tingling in his hands.  Past Medical History:  Diagnosis Date  . Cancer [facility])    prostate  . Diabetes mellitus without complication ([place])   . Hypertension   . Peripheral vascular disease ([place])    PE 35 years after a broken leg    Past Surgical History:  Procedure Laterality Date  . COLONOSCOPY    . PROSTATECTOMY      History reviewed. No pertinent family history. Social History:  reports that he quit smoking about 20 years ago. He has never used smokeless tobacco. He reports that he does not drink alcohol or use drugs.  Allergies: No Known Allergies  Medications Prior to Admission  Medication Sig Dispense Refill  . carbamazepine (TEGRETOL) 200 MG tablet Take 200 mg by mouth 4 (four) times daily.    . metFORMIN (GLUCOPHAGE) 500 MG tablet Take 500 mg by mouth 2 (two) times a week.  3  . oxyCODONE-acetaminophen (PERCOCET/ROXICET) 5-325 MG tablet Take 1 tablet by mouth every 4 (four) hours as needed for severe pain. 20 tablet 0  . pantoprazole (PROTONIX) 20 MG tablet Take 20 mg by mouth daily.    . pioglitazone (ACTOS) 15 MG tablet Take 15 mg by mouth every morning.      Results for orders placed or performed during the hospital encounter of 02/07/18 (from the past 48 hour(s))  Glucose, capillary     Status: Abnormal   Collection Time: 02/07/18  3:02 PM  Result Value Ref Range   Glucose-Capillary 113 (H) 70 - 99 mg/dL   Comment 1 Notify RN    Comment 2 Document in Chart    No results found.  Review of Systems  Musculoskeletal: Positive for joint pain.   All other systems reviewed and are negative.   Blood pressure (!) 173/85, pulse 65, resp. rate 18, height 5\' 8"  (1.727 m), weight 90.7 kg, SpO2 95 %. Physical Exam  Constitutional: He appears well-developed.  HENT:  Head: Normocephalic.  Eyes: Pupils are equal, round, and reactive to light.  Neck: Normal range of motion.  Cardiovascular: Normal rate.  Respiratory: Effort normal.  Neurological: He is alert.  Skin: Skin is warm.  Psychiatric: He has a normal mood and affect.  Examination of the left arm demonstrates some bruising and ecchymosis around the lateral midshaft humeral region.  The fracture is grossly mobile.  Nerve function is intact to the median radial and ulnar nerve.  Radial pulses palpable.  Skin is intact on the anterior posterior aspects of the arm.  Assessment/Plan Impression is left midshaft humerus fracture.  Plan is open reduction internal fixation.  A long talk with [name] about operative and nonoperative options for this.  In general this is more of a spiral fracture with a broader area I think it would be more likely to heal.  As it is this is a transverse fracture with a large moment arm.  I think his best option would be open reduction internal fixation.  Risk and benefits are discussed including but not limited to infection nerve vessel damage  nonunion malunion and potential need for more surgery.  All questions answered  Anderson Malta, MD 02/07/2018, 3:33 PM

## 2018-02-07 NOTE — Transfer of Care (Signed)
Immediate Anesthesia Transfer of Care Note  Patient: Jose Alvarado  Procedure(s) Performed: LEFT HUMERUS OPEN REDUCTION INTERNAL FIXATION (ORIF) (Left Shoulder)  Patient Location: PACU  Anesthesia Type:GA combined with regional for post-op pain  Level of Consciousness: awake, alert , oriented and patient cooperative  Airway & Oxygen Therapy: Patient Spontanous Breathing and Patient connected to nasal cannula oxygen  Post-op Assessment: Report given to RN, Post -op Vital signs reviewed and stable and Moving all extremities except left arm  Post vital signs: Reviewed and stable  Last Vitals:  Vitals Value Taken Time  BP 152/105 02/07/2018  7:30 PM  Temp    Pulse 71 02/07/2018  7:31 PM  Resp 9 02/07/2018  7:31 PM  SpO2 96 % 02/07/2018  7:31 PM  Vitals shown include unvalidated device data.  Last Pain:  Vitals:   02/07/18 1503  PainSc: 5          Complications: No apparent anesthesia complications

## 2018-02-07 NOTE — Brief Op Note (Signed)
02/07/2018  7:34 PM  PATIENT:  Jose Alvarado  77 y.o. male  PRE-OPERATIVE DIAGNOSIS:  LEFT HUMERUS FRACTURE  POST-OPERATIVE DIAGNOSIS:  LEFT HUMERUS FRACTURE  PROCEDURE:  Procedure(s): LEFT HUMERUS OPEN REDUCTION INTERNAL FIXATION (ORIF)  SURGEON:  Surgeon(s): Marlou Sa, Tonna Corner, MD  ASSISTANT: Purvis Kilts rnfa  ANESTHESIA:   general  EBL: 100 ml    Total I/O In: 500 [I.V.:500] Out: -   BLOOD ADMINISTERED: none  DRAINS: none   LOCAL MEDICATIONS USED:  none  SPECIMEN:  No Specimen  COUNTS:  YES  TOURNIQUET:  * No tourniquets in log *  DICTATION: .Other Dictation: Dictation Number [id number]  PLAN OF CARE: Discharge to home after PACU  PATIENT DISPOSITION:  PACU - hemodynamically stable

## 2018-02-07 NOTE — Anesthesia Procedure Notes (Signed)
Procedure Name: Intubation Date/Time: 02/07/2018 4:53 PM Performed by: White, Amedeo Plenty, CRNA Pre-anesthesia Checklist: Patient identified, Emergency Drugs available, Suction available and Patient being monitored Patient Re-evaluated:Patient Re-evaluated prior to induction Oxygen Delivery Method: Circle System Utilized Preoxygenation: Pre-oxygenation with 100% oxygen Induction Type: IV induction Ventilation: Mask ventilation without difficulty and Oral airway inserted - appropriate to patient size Laryngoscope Size: Mac and 4 Grade View: Grade I Tube type: Oral Tube size: 7.5 mm Number of attempts: 1 Airway Equipment and Method: Stylet and Oral airway Placement Confirmation: ETT inserted through vocal cords under direct vision,  positive ETCO2 and breath sounds checked- equal and bilateral Secured at: 22 cm Tube secured with: Tape Dental Injury: Teeth and Oropharynx as per pre-operative assessment

## 2018-02-08 ENCOUNTER — Encounter (HOSPITAL_COMMUNITY): Payer: Self-pay | Admitting: Orthopedic Surgery

## 2018-02-08 NOTE — Op Note (Signed)
NAMECLIFORD, Jose Alvarado MEDICAL RECORD MR:[id number] ACCOUNT [id number] DATE OF BIRTH:[birth year] FACILITY: [facility] LOCATION: [facility] PHYSICIAN:GREGORY Randel Pigg, MD  OPERATIVE REPORT  DATE OF PROCEDURE:  02/07/2018  PREOPERATIVE DIAGNOSIS:  Left humeral shaft fracture.  POSTOPERATIVE DIAGNOSIS:  Left humeral shaft fracture.  PROCEDURE:  Left humeral shaft fracture open reduction internal fixation.  SURGEON:  Meredith Pel, MD.  ASSISTANT:  Laure Kidney, RNFA  INDICATIONS:  The patient is a 77 year old patient sustained a left arm fracture a week ago.  Presents for operative management after explanation of risks and benefits.  IMPLANTS UTILIZED:  Include Zimmer 10-hole 4.5 mm large fragment plate with 1 lag screw and  9 screws through the plate with 8 cortices above the fracture and 8 cortices below the fracture.  PROCEDURE IN DETAIL:  The patient was brought to the operating room where general anesthetic was induced.  Preoperative antibiotics administered.  Timeout was called.  Left arm prescrubbed with alcohol, Betadine and allowed to air dry, prepped with  DuraPrep solution and draped in a sterile manner.  Charlie Pitter was used to cover the operative field.  An incision was made, anterolateral approach to the humerus.  This incision was about 20 cm.  Skin and subcutaneous tissue were sharply divided.  Biceps  muscle, fascia was incised and the biceps was mobilized medially.  Biceps brachii was then split bluntly.  The fracture was visualized.  Crossing vessels were ligated.  At this time, the fracture was reduced.  Thorough irrigation was performed.  A lag  screw was placed on the medial aspect of the humeral shaft.  This gave good reduction of the fracture.  Biomet 10-hole plate was then applied.  Screws were placed proximally and distally in compression mode for the first 2 screws.  This gave excellent  fracture fixation and good stability.  Four screws above and 4 screws below the  fracture were obtained.  Two each were locked.  Fluoroscopic guidance demonstrated excellent reduction of the fracture and good placement of the screws with no screws proud  on the posterior aspect of the humerus.  At this time, thorough irrigation was performed and the fascia was closed using 0 Vicryl suture followed by interrupted inverted 2-0 Vicryl suture and a 3-0 Monocryl.  Steri-Strips and Aquacel dressing and a sling  was placed.  The patient tolerated the procedure well without immediate complications.    It should be noted that care was taken to avoid any overdrilling or plunging when drilling the bone for the plate.  AN/NUANCE  D:02/07/2018 T:02/08/2018 JOB:[id number]

## 2018-02-13 NOTE — Anesthesia Postprocedure Evaluation (Signed)
Anesthesia Post Note  Patient: Saheed Carrington  Procedure(s) Performed: LEFT HUMERUS OPEN REDUCTION INTERNAL FIXATION (ORIF) (Left Shoulder)     Patient location during evaluation: PACU Anesthesia Type: Regional and General Level of consciousness: awake and alert Pain management: pain level controlled Vital Signs Assessment: post-procedure vital signs reviewed and stable Respiratory status: spontaneous breathing, nonlabored ventilation, respiratory function stable and patient connected to nasal cannula oxygen Cardiovascular status: blood pressure returned to baseline and stable Postop Assessment: no apparent nausea or vomiting Anesthetic complications: no    Last Vitals:  Vitals:   02/07/18 2030 02/07/18 2045  BP: (!) 155/93 (!) 144/82  Pulse: 72 72  Resp: (!) 22 20  Temp: 36.9 C   SpO2: 100% 94%    Last Pain:  Vitals:   02/07/18 2045  PainSc: 3                  Creedence Kunesh

## 2018-02-15 ENCOUNTER — Ambulatory Visit (INDEPENDENT_AMBULATORY_CARE_PROVIDER_SITE_OTHER): Payer: Medicare Other

## 2018-02-15 ENCOUNTER — Encounter (INDEPENDENT_AMBULATORY_CARE_PROVIDER_SITE_OTHER): Payer: Self-pay | Admitting: Orthopedic Surgery

## 2018-02-15 ENCOUNTER — Ambulatory Visit (INDEPENDENT_AMBULATORY_CARE_PROVIDER_SITE_OTHER): Payer: Medicare Other | Admitting: Orthopedic Surgery

## 2018-02-15 DIAGNOSIS — S42322A Displaced transverse fracture of shaft of humerus, left arm, initial encounter for closed fracture: Secondary | ICD-10-CM | POA: Diagnosis not present

## 2018-02-15 NOTE — Progress Notes (Signed)
   Post-Op Visit Note   Patient: Jose Alvarado           Date of Birth: 1940/11/08           MRN: [id number] Visit Date: 02/15/2018 PCP: Patient, No Pcp Per   Assessment & Plan:  Chief Complaint:  Chief Complaint  Patient presents with  . Left Upper Arm - Routine Post Op   Visit Diagnoses:  1. Displaced transverse fracture of shaft of humerus, left arm, initial encounter for closed fracture     Plan: Jose Alvarado is a patient is a week out from left humerus fracture fixation.  On exam the incision is intact radial nerve is functional.  Radiographs look good.  Plan is for him to do elbow range of motion daily.  Sling is optional but no lifting with that left arm.  I will see him back in 2 weeks for clinical recheck.  I do not think he will need x-rays at that time but I just want to check on his motion.  Today he is about 100 degrees of flexion and lacking about 30 degrees and extension  Follow-Up Instructions: No follow-ups on file.   Orders:  Orders Placed This Encounter  Procedures  . XR Humerus Left   No orders of the defined types were placed in this encounter.   Imaging: Xr Humerus Left  Result Date: 02/15/2018 AP lateral left humerus reviewed.  Plate fixation of humeral shaft fracture in good position alignment with no complicating features.   PMFS History: Patient Active Problem List   Diagnosis Date Noted  . Displaced transverse fracture of shaft of humerus, left arm, initial encounter for closed fracture    Past Medical History:  Diagnosis Date  . Cancer [facility])    prostate  . Diabetes mellitus without complication ([place])   . Hypertension   . Peripheral vascular disease ([place])    PE 35 years after a broken leg    No family history on file.  Past Surgical History:  Procedure Laterality Date  . COLONOSCOPY    . ORIF HUMERUS FRACTURE Left 02/07/2018   Procedure: LEFT HUMERUS OPEN REDUCTION INTERNAL FIXATION (ORIF);  Surgeon: Jose Pel, MD;  Location: [place];  Service: Orthopedics;  Laterality: Left;  . PROSTATECTOMY     Social History   Occupational History  . Not on file  Tobacco Use  . Smoking status: Former Smoker    Last attempt to quit: 02/01/1998    Years since quitting: 20.0  . Smokeless tobacco: Never Used  Substance and Sexual Activity  . Alcohol use: Never    Frequency: Never  . Drug use: Never  . Sexual activity: Not on file

## 2018-02-20 ENCOUNTER — Telehealth (INDEPENDENT_AMBULATORY_CARE_PROVIDER_SITE_OTHER): Payer: Self-pay

## 2018-02-20 NOTE — Telephone Encounter (Signed)
Ok fpr flexeril 5 po q 8 # 30 PLS [facility]ALS THX

## 2018-02-20 NOTE — Telephone Encounter (Signed)
Insurance asking if we can change his medication from robaxin to tizanidine or cyclobenzaprine

## 2018-02-21 MED ORDER — CYCLOBENZAPRINE HCL 5 MG PO TABS: 5.0000 mg | ORAL_TABLET | Freq: Three times a day (TID) | ORAL | 0 refills | Status: AC | PRN

## 2018-02-21 NOTE — Addendum Note (Signed)
Addended by: Brand Males E on: 02/21/2018 12:12 PM   Modules accepted: Orders

## 2018-02-21 NOTE — Telephone Encounter (Signed)
Rx sent in to pharm 

## 2018-03-01 ENCOUNTER — Encounter (INDEPENDENT_AMBULATORY_CARE_PROVIDER_SITE_OTHER): Payer: Self-pay | Admitting: Orthopedic Surgery

## 2018-03-01 ENCOUNTER — Ambulatory Visit (INDEPENDENT_AMBULATORY_CARE_PROVIDER_SITE_OTHER): Payer: Medicare Other | Admitting: Orthopedic Surgery

## 2018-03-01 DIAGNOSIS — S42322A Displaced transverse fracture of shaft of humerus, left arm, initial encounter for closed fracture: Secondary | ICD-10-CM

## 2018-03-05 ENCOUNTER — Encounter (INDEPENDENT_AMBULATORY_CARE_PROVIDER_SITE_OTHER): Payer: Self-pay | Admitting: Orthopedic Surgery

## 2018-03-05 NOTE — Progress Notes (Signed)
   Post-Op Visit Note   Patient: Jose Alvarado           Date of Birth: [birth year]           MRN: [id number] Visit Date: 03/01/2018 PCP: Patient, No Pcp Per   Assessment & Plan:  Chief Complaint:  Chief Complaint  Patient presents with  . Left Shoulder - Fracture, Follow-up   Visit Diagnoses:  1. Displaced transverse fracture of shaft of humerus, left arm, initial encounter for closed fracture     Plan: Jose Alvarado is a patient who is now 3 weeks out left humeral shaft fracture.  On exam he has nearly full extension and flexion lacking about 10 degrees.  He is not running out of any of his pain meds or spasm medication.  On exam he has good strength.  I saw him to be careful and not do any lifting but range of motion is good.  Come back in 4 weeks for repeat radiographs and likely release at that time.  He did have very stable fixation of the fracture.  Follow-Up Instructions: Return in about 4 weeks (around 03/29/2018).   Orders:  No orders of the defined types were placed in this encounter.  No orders of the defined types were placed in this encounter.   Imaging: No results found.  PMFS History: Patient Active Problem List   Diagnosis Date Noted  . Displaced transverse fracture of shaft of humerus, left arm, initial encounter for closed fracture    Past Medical History:  Diagnosis Date  . Cancer [facility])    prostate  . Diabetes mellitus without complication ([place])   . Hypertension   . Peripheral vascular disease ([place])    PE 35 years after a broken leg    History reviewed. No pertinent family history.  Past Surgical History:  Procedure Laterality Date  . COLONOSCOPY    . ORIF HUMERUS FRACTURE Left 02/07/2018   Procedure: LEFT HUMERUS OPEN REDUCTION INTERNAL FIXATION (ORIF);  Surgeon: Meredith Pel, MD;  Location: [place];  Service: Orthopedics;  Laterality: Left;  . PROSTATECTOMY     Social History   Occupational History  . Not on file  Tobacco Use  . Smoking  status: Former Smoker    Last attempt to quit: 02/01/1998    Years since quitting: 20.1  . Smokeless tobacco: Never Used  Substance and Sexual Activity  . Alcohol use: Never    Frequency: Never  . Drug use: Never  . Sexual activity: Not on file

## 2018-03-29 ENCOUNTER — Ambulatory Visit (INDEPENDENT_AMBULATORY_CARE_PROVIDER_SITE_OTHER): Payer: Medicare Other

## 2018-03-29 ENCOUNTER — Ambulatory Visit (INDEPENDENT_AMBULATORY_CARE_PROVIDER_SITE_OTHER): Payer: Medicare Other | Admitting: Orthopedic Surgery

## 2018-03-29 ENCOUNTER — Encounter (INDEPENDENT_AMBULATORY_CARE_PROVIDER_SITE_OTHER): Payer: Self-pay | Admitting: Orthopedic Surgery

## 2018-03-29 DIAGNOSIS — S42322A Displaced transverse fracture of shaft of humerus, left arm, initial encounter for closed fracture: Secondary | ICD-10-CM

## 2018-03-29 NOTE — Progress Notes (Signed)
   Post-Op Visit Note   Patient: Jose Alvarado           Date of Birth: 1940/10/04           MRN: [id number] Visit Date: 03/29/2018 PCP: Patient, No Pcp Per   Assessment & Plan:  Chief Complaint:  Chief Complaint  Patient presents with  . Left Shoulder - Pain, Follow-up    HUMERUS   Visit Diagnoses:  1. Displaced transverse fracture of shaft of humerus, left arm, initial encounter for closed fracture     Plan: Jose Alvarado is a 78 year old patient for who is now 50 days out from left humerus open reduction internal fixation.  He does Architect work.  He has been doing a little bit of lifting.  On exam no pain with torsional rotation of that left arm.  Bicep strength is improving.  Elbow range of motion is normal.  Radiographs look good.  I will release him at this time.  I do want him to limit his lifting to about no more than 20 pounds for the next month and then he can do activity as tolerated.  Follow-up with me as needed.  Follow-Up Instructions: Return if symptoms worsen or fail to improve.   Orders:  Orders Placed This Encounter  Procedures  . XR Humerus Left   No orders of the defined types were placed in this encounter.   Imaging: Xr Humerus Left  Result Date: 03/29/2018 AP lateral left humerus reviewed.  Plate fixation of humeral shaft fracture in good position alignment with no complicating features.  No lucency around any of the screws.  Callus formation is present around the fracture site.   PMFS History: Patient Active Problem List   Diagnosis Date Noted  . Displaced transverse fracture of shaft of humerus, left arm, initial encounter for closed fracture    Past Medical History:  Diagnosis Date  . Cancer [facility])    prostate  . Diabetes mellitus without complication ([place])   . Hypertension   . Peripheral vascular disease ([place])    PE 35 years after a broken leg    History reviewed. No pertinent family history.  Past Surgical History:  Procedure Laterality  Date  . COLONOSCOPY    . ORIF HUMERUS FRACTURE Left 02/07/2018   Procedure: LEFT HUMERUS OPEN REDUCTION INTERNAL FIXATION (ORIF);  Surgeon: Meredith Pel, MD;  Location: [place];  Service: Orthopedics;  Laterality: Left;  . PROSTATECTOMY     Social History   Occupational History  . Not on file  Tobacco Use  . Smoking status: Former Smoker    Last attempt to quit: 02/01/1998    Years since quitting: 20.1  . Smokeless tobacco: Never Used  Substance and Sexual Activity  . Alcohol use: Never    Frequency: Never  . Drug use: Never  . Sexual activity: Not on file

## 2020-04-04 IMAGING — DX DG HUMERUS 2V *L*
2 series · 2 of 2 positions shown · non-contrast
Comparison: None.

CLINICAL DATA: Acute LEFT arm pain following tree injury today.
Initial encounter.

EXAM:
LEFT HUMERUS - 2+ VIEW

[humerus ap]
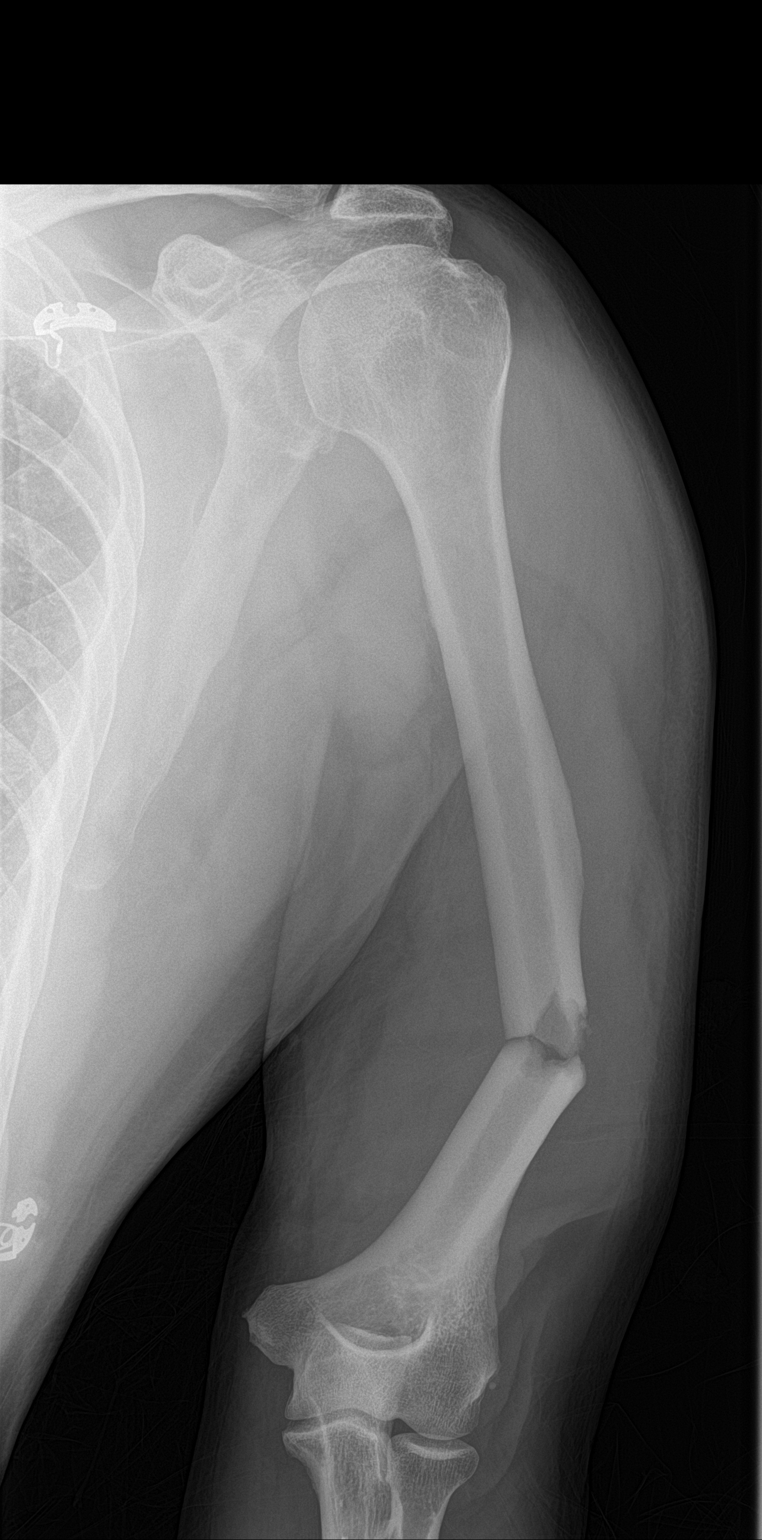

[humerus lat]
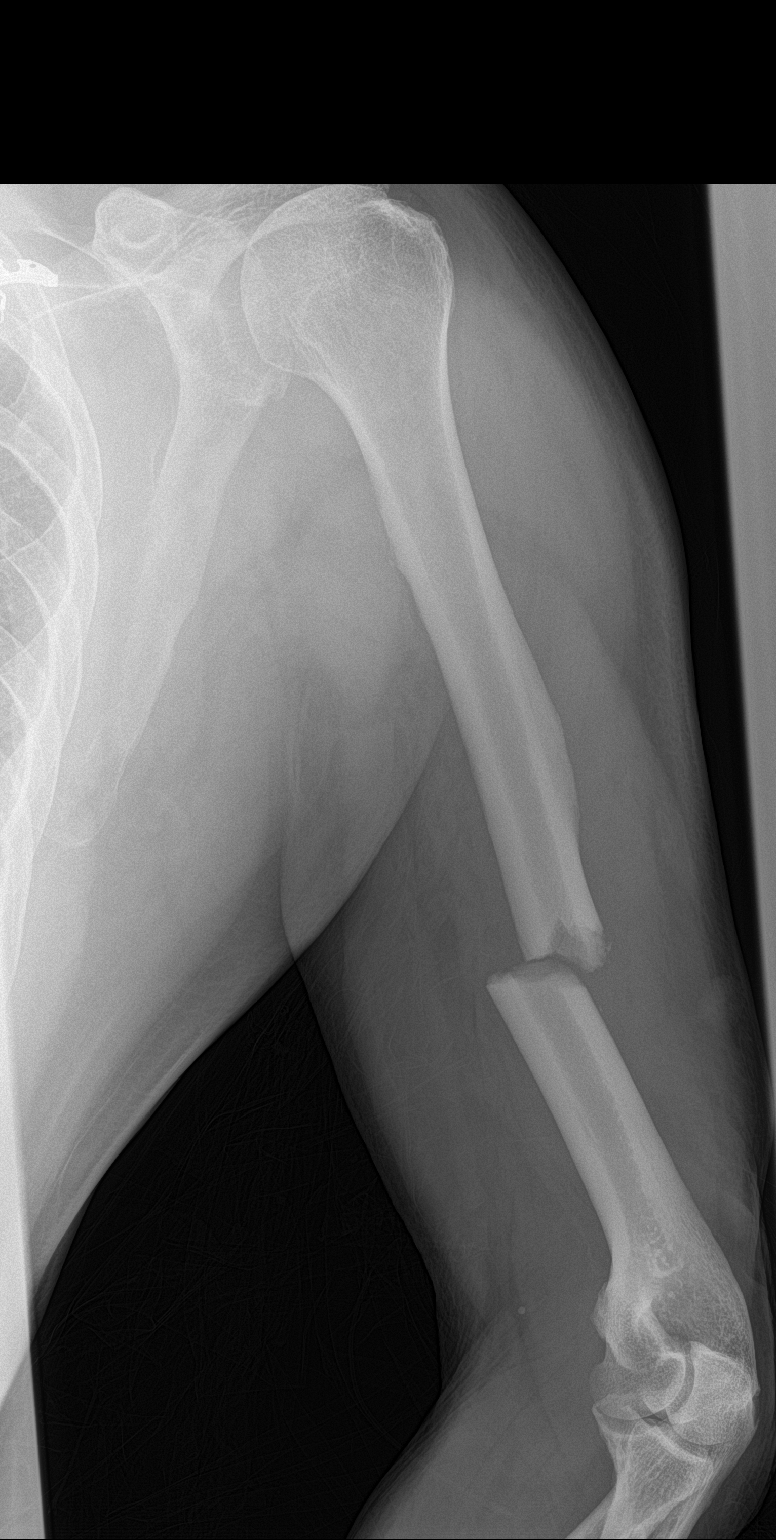

[2 of 2 positions shown; findings below may reference images not displayed]

FINDINGS: A transverse fracture of the mid-distal humeral diaphysis is noted
with 1 cm anterior displacement and apex LATERAL angulation.

No other acute bony or joint abnormalities noted.
IMPRESSION: Displaced angulated mid-distal humeral fracture.

## 2020-04-04 IMAGING — CT CT ABD-PELV W/ CM
2 of 5 series · 13 of 46 positions shown, 15 images · IV contrast (omnipaque)
Comparison: Chest radiograph from 02/01/2018

CLINICAL DATA: Enlarged branch struck the patient in his left side
today. Seizure. Blunt abdominal trauma.

EXAM:
CT CHEST, ABDOMEN, AND PELVIS WITH CONTRAST
TECHNIQUE: Multidetector CT imaging of the chest, abdomen and pelvis was
performed following the standard protocol during bolus
administration of intravenous contrast.
CONTRAST:  100mL OMNIPAQUE IOHEXOL 300 MG/ML  SOLN

[Series 3: cap with · axial · 0.89mm/px · z∈[-890,-300]mm · 10 of 140 slices shown, 12 images]
[im 11/140  soft-tissue]
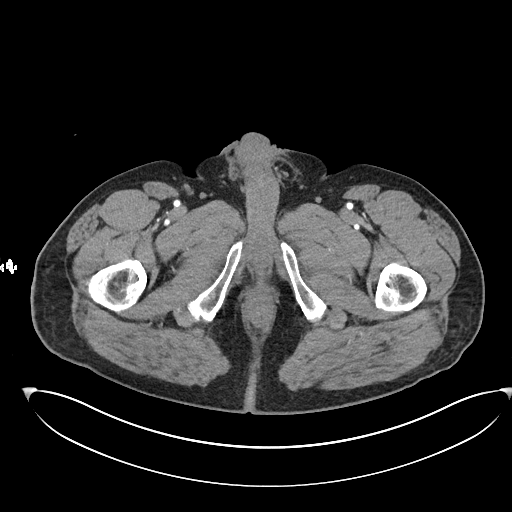
[im 11/140  bone]
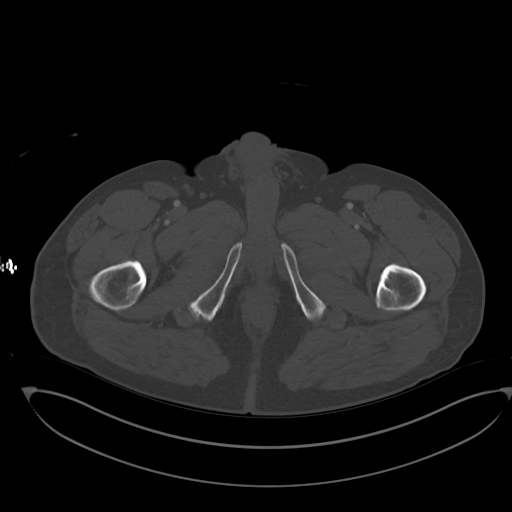
[im 22/140  soft-tissue]
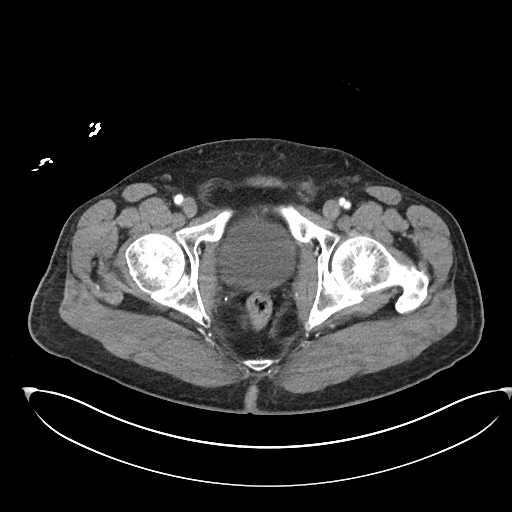
[im 43/140  soft-tissue]
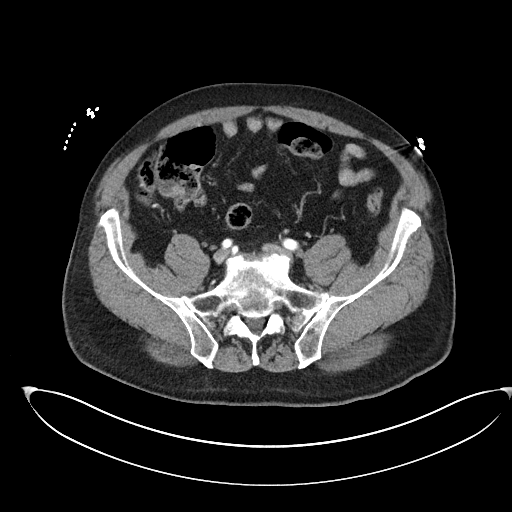
[im 54/140  soft-tissue]
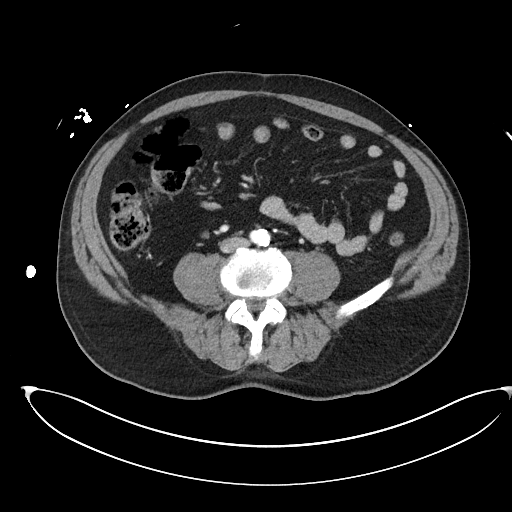
[im 65/140  soft-tissue]
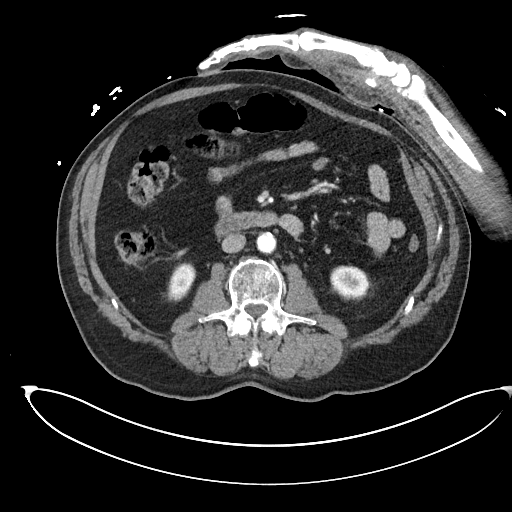
[im 75/140  soft-tissue]
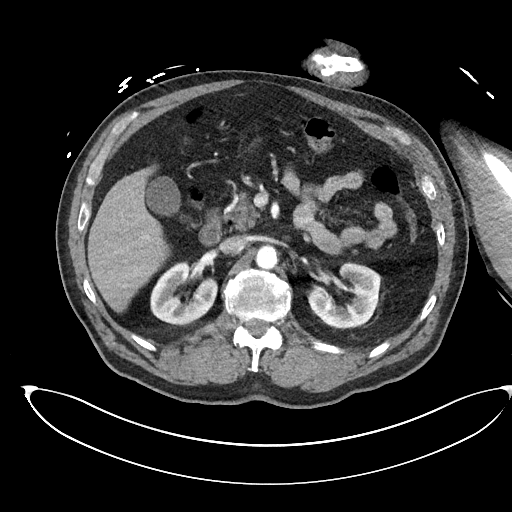
[im 86/140  soft-tissue]
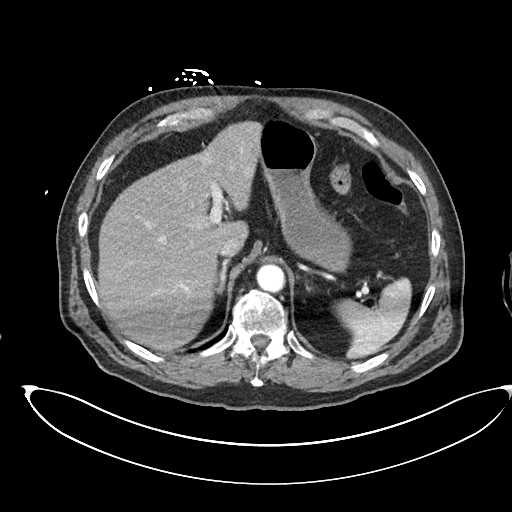
[im 107/140  soft-tissue]
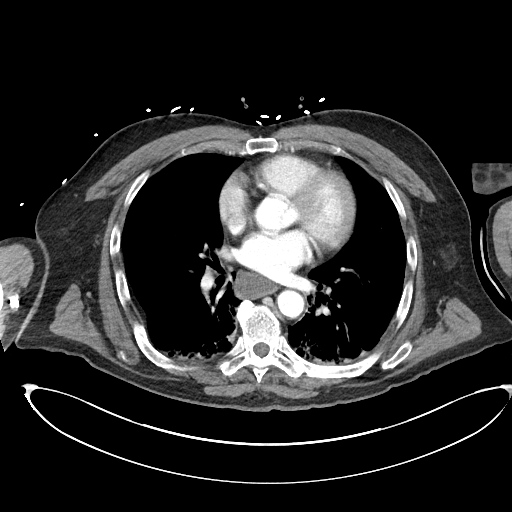
[im 118/140  soft-tissue]
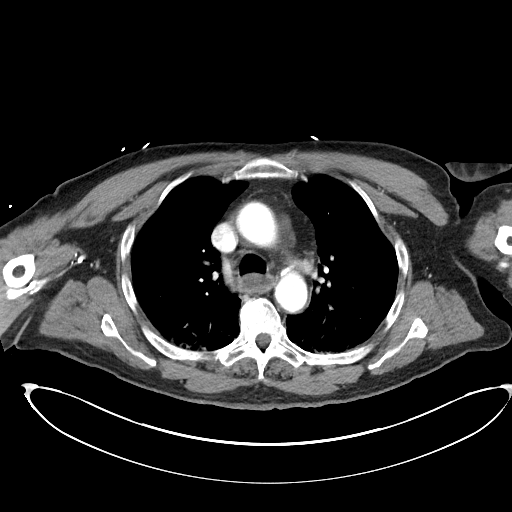
[im 118/140  bone]
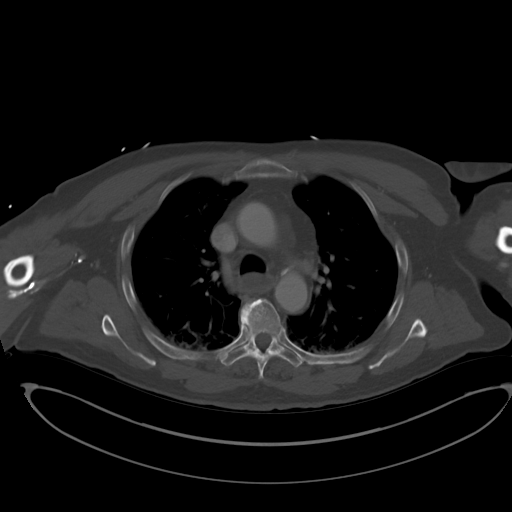
[im 129/140  soft-tissue]
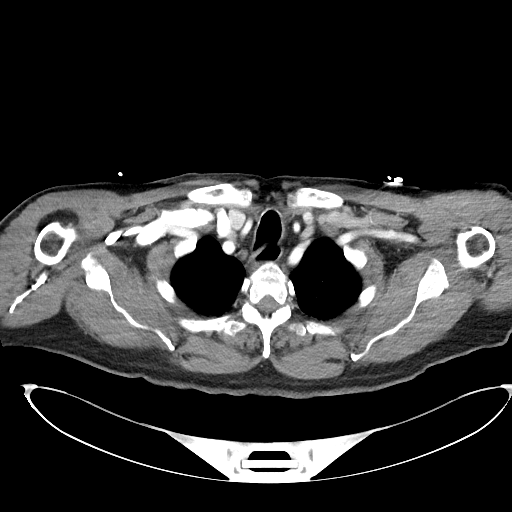

[Series 6: cor · coronal · 0.86mm/px · 3 of 107 slices shown]
[im 36/107  soft-tissue]
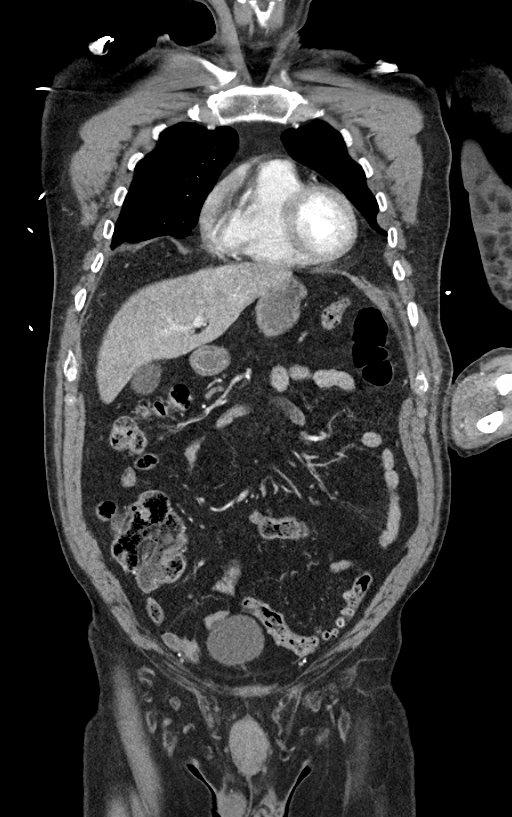
[im 48/107  soft-tissue]
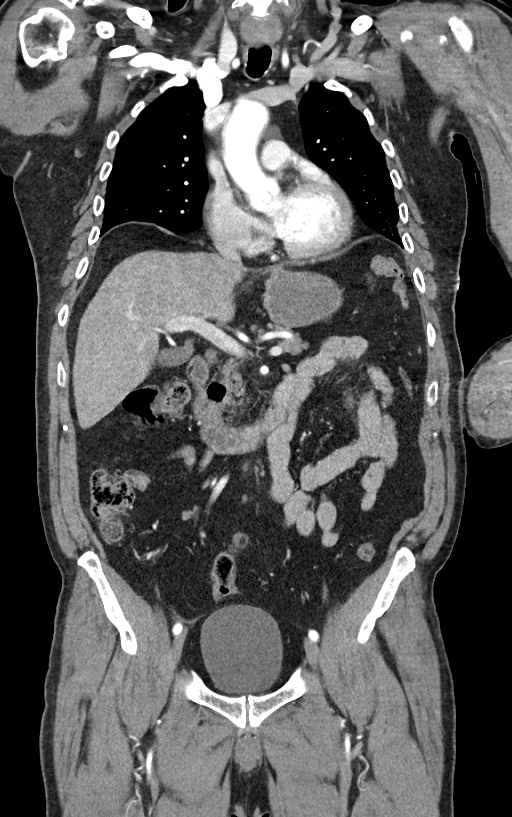
[im 59/107  soft-tissue]
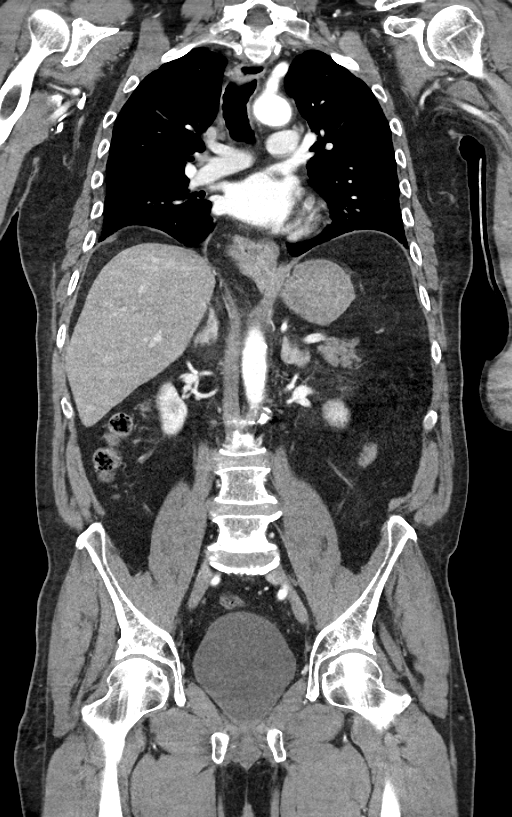

[13 of 46 positions shown; findings below may reference images not displayed]

FINDINGS: CT CHEST FINDINGS

Cardiovascular: Atherosclerotic calcification of the aortic arch.
Mild cardiomegaly.

Mediastinum/Nodes: Small type 1 hiatal hernia. Dilated fluid-filled
esophagus.

Lungs/Pleura: 0.8 by 0.8 cm right upper lobe pulmonary nodule, image
40/5. Dependent atelectasis in both lungs. Mild secondary pulmonary
lobular interstitial accentuation at the lung apices. Mild lingular
atelectasis. No appreciable pneumothorax.

Musculoskeletal: On the scout image there is a left humeral shaft
fracture. Old healed right posterolateral rib fractures are
observed.

The left lateral seventh rib fracture shown on radiography is
nondisplaced and only very faintly seen, on image 119/5 of today's
exam.

Thoracic spondylosis. Minimal superior endplate wedging of the T12
vertebral body is thought to be chronic.

CT ABDOMEN PELVIS FINDINGS

Hepatobiliary: 8 mm enhancing lesion in segment 2 of the liver on
image 32/6 is thought to probably be a vascular malformation given
the draining portal vein appearance. I am very skeptical of a
hepatic laceration given the lack of surrounding fluid density.
There is some slight lobularity of the lateral segment left hepatic
lobe. Gallbladder unremarkable.

Pancreas: Unremarkable

Spleen: When account is made for the streak artifact, no splenic
lesion is identified. No surrounding perisplenic ascites.

Adrenals/Urinary Tract: The adrenal glands appear normal.

2 mm right kidney lower pole nonobstructive renal calculus on image
66/6. Mild scarring of the right kidney upper pole.

Stomach/Bowel: Small periampullary duodenal diverticulum. Proximal
sigmoid colon diverticulosis.

Vascular/Lymphatic: Aortoiliac atherosclerotic vascular disease.
Portacaval node 1.0 cm in short axis on image 62/6.

Reproductive: Prostatectomy.

Other: No supplemental non-categorized findings.

Musculoskeletal: Unremarkable
IMPRESSION: 1. Left humeral shaft fracture is visible on the scout image.
2. Nondisplaced left lateral seventh rib fracture appears to be
acute. Old right-sided rib fractures are healed.
3. Mildly dilated fluid-filled esophagus potentially from
dysmotility or reflux.
4. 8 mm in diameter right upper lobe pulmonary nodule, image 40/5.
Non-contrast chest CT at 6-12 months is recommended. If the nodule
is stable at time of repeat CT, then future CT at 18-24 months (from
today's scan) is considered optional for low-risk patients, but is
recommended for high-risk patients. This recommendation follows the
consensus statement: Guidelines for Management of Incidental
Pulmonary Nodules Detected on CT Images: From the [HOSPITAL]
5. 8 mm in diameter enhancing lesion in segment 2 of the liver with
mild lobularity in the lateral segment left hepatic lobe. Overall I
favor this appearance being due to a small vascular malformation
given the prominent draining portal vein. I am skeptical that this
is a laceration given the lack of surrounding perihepatic ascites or
other secondary findings of local injury. This lesion could be
further worked up with dedicated hepatic protocol MRI with and
without contrast if clinically warranted, for example if the patient
has a history of known gastrointestinal malignancy or abnormal liver
enzymes.
6. Other imaging findings of potential clinical significance: Aortic
Atherosclerosis (JVFRT-U30.0). Mild cardiomegaly. Small type 1
hiatal hernia. Nonobstructive right nephrolithiasis. Proximal
sigmoid colon diverticulosis. Prostatectomy.
